# Patient Record
Sex: Male | Born: 1990 | Race: White | Hispanic: No | Marital: Single | Smoking: Never smoker
Health system: Southern US, Community
[De-identification: ages and names within clinical notes are randomized; demographics above are authoritative.]

---

## 2019-11-06 ENCOUNTER — Other Ambulatory Visit: Payer: Self-pay

## 2019-11-06 ENCOUNTER — Emergency Department (HOSPITAL_BASED_OUTPATIENT_CLINIC_OR_DEPARTMENT_OTHER): Payer: BC Managed Care – PPO

## 2019-11-06 ENCOUNTER — Encounter (HOSPITAL_BASED_OUTPATIENT_CLINIC_OR_DEPARTMENT_OTHER): Payer: Self-pay | Admitting: *Deleted

## 2019-11-06 ENCOUNTER — Emergency Department (HOSPITAL_BASED_OUTPATIENT_CLINIC_OR_DEPARTMENT_OTHER)
Admission: EM | Admit: 2019-11-06 | Discharge: 2019-11-07 | Disposition: A | Discharge status: Home / Self Care | Payer: BC Managed Care – PPO | Attending: Emergency Medicine | Admitting: Emergency Medicine

## 2019-11-06 DIAGNOSIS — M545 Low back pain, unspecified: Secondary | ICD-10-CM

## 2019-11-06 DIAGNOSIS — Z91010 Allergy to peanuts: Secondary | ICD-10-CM | POA: Diagnosis not present

## 2019-11-06 MED ORDER — IBUPROFEN 800 MG PO TABS
800.0000 mg | ORAL_TABLET | Freq: Once | ORAL | Status: AC
  Administered 2019-11-06: 800 mg via ORAL
  Filled 2019-11-06: qty 1

## 2019-11-06 MED ORDER — ACETAMINOPHEN 500 MG PO TABS
1000.0000 mg | ORAL_TABLET | Freq: Once | ORAL | Status: DC
  Filled 2019-11-06: qty 2

## 2019-11-06 NOTE — ED Triage Notes (Signed)
C/o lower back pain non radiating x 5 hrs , denies injury

## 2019-11-07 MED ORDER — LIDOCAINE 5 % EX PTCH: 1.0000 | MEDICATED_PATCH | CUTANEOUS | 0 refills | Status: AC

## 2019-11-07 MED ORDER — NAPROXEN 375 MG PO TABS: 375.0000 mg | ORAL_TABLET | Freq: Two times a day (BID) | ORAL | 0 refills | Status: AC

## 2019-11-07 NOTE — ED Provider Notes (Signed)
[facility] EMERGENCY DEPARTMENT Provider Note   CSN: [id number] Arrival date & time: 11/06/19  2007     History Chief Complaint  Patient presents with  . Back Pain    Joshua Weaver is a 29 y.o. male.  The history is provided by the patient.  Back Pain Location:  Sacro-iliac joint Quality:  Aching Radiates to:  Does not radiate Pain severity:  Severe Pain is:  Same all the time Onset quality:  Gradual Duration:  6 hours Timing:  Constant Progression:  Unchanged Chronicity:  New Context: not emotional stress, not falling, not jumping from heights and not lifting heavy objects   Relieved by:  Nothing Worsened by:  Nothing Ineffective treatments:  None tried Associated symptoms: no abdominal pain, no chest pain, no fever, no numbness and no weakness   Risk factors: no hx of cancer   Patient with low back pain since sitting at his desk this evening.  No weakness, no numbness.  No gait abnormalities.  No bowel or bladder symptoms.  No f/c/r.  No trauma.       History reviewed. No pertinent past medical history.  There are no problems to display for this patient.   History reviewed. No pertinent surgical history.     No family history on file.  Social History   Tobacco Use  . Smoking status: Never Smoker  Substance Use Topics  . Alcohol use: Not Currently  . Drug use: Not Currently    Home Medications Prior to Admission medications   Not on File    Allergies    Peanut oil  Review of Systems   Review of Systems  Constitutional: Negative for fever.  HENT: Negative for congestion.   Eyes: Negative for visual disturbance.  Respiratory: Negative for apnea.   Cardiovascular: Negative for chest pain.  Gastrointestinal: Negative for abdominal pain.  Genitourinary: Negative for difficulty urinating.  Musculoskeletal: Positive for back pain. Negative for gait problem.  Neurological: Negative for dizziness, speech difficulty, weakness and numbness.    Psychiatric/Behavioral: Negative for agitation.  All other systems reviewed and are negative.   Physical Exam Updated Vital Signs BP 128/74 (BP Location: Left Arm)   Pulse 74   Temp 98.4 F (36.9 C) (Oral)   Resp 17   Ht 6' (1.829 m)   Wt 101.2 kg   SpO2 99%   BMI 30.24 kg/m   Physical Exam Vitals and nursing note reviewed.  Constitutional:      General: He is not in acute distress.    Appearance: Normal appearance.  HENT:     Head: Normocephalic and atraumatic.     Nose: Nose normal.  Eyes:     Conjunctiva/sclera: Conjunctivae normal.     Pupils: Pupils are equal, round, and reactive to light.  Cardiovascular:     Rate and Rhythm: Normal rate and regular rhythm.     Pulses: Normal pulses.     Heart sounds: Normal heart sounds.  Pulmonary:     Effort: Pulmonary effort is normal.     Breath sounds: Normal breath sounds.  Abdominal:     General: Abdomen is flat. Bowel sounds are normal.     Tenderness: There is no abdominal tenderness. There is no guarding or rebound.  Musculoskeletal:        General: Normal range of motion.     Cervical back: Normal, normal range of motion and neck supple.     Thoracic back: Normal.     Lumbar back: Normal.  Skin:    General: Skin is warm and dry.     Capillary Refill: Capillary refill takes less than 2 seconds.  Neurological:     General: No focal deficit present.     Mental Status: He is alert and oriented to person, place, and time.     Sensory: No sensory deficit.     Motor: No weakness.     Deep Tendon Reflexes: Reflexes normal.  Psychiatric:        Mood and Affect: Mood normal.        Behavior: Behavior normal.     ED Results / Procedures / Treatments   Labs (all labs ordered are listed, but only abnormal results are displayed) Labs Reviewed - No data to display  EKG None  Radiology DG Lumbar Spine Complete  Result Date: 11/07/2019 CLINICAL DATA:  Non radiating low back pain for 5 hours.  No injury. EXAM:  LUMBAR SPINE - COMPLETE 4+ VIEW COMPARISON:  None. FINDINGS: There is no evidence of lumbar spine fracture. Alignment is normal. Intervertebral disc spaces are maintained. IMPRESSION: Negative. Electronically Signed   By: Burman Nieves M.D.   On: 11/07/2019 00:14    Procedures Procedures (including critical care time)  Medications Ordered in ED Medications  acetaminophen (TYLENOL) tablet 1,000 mg (1,000 mg Oral Not Given 11/06/19 2340)  ibuprofen (ADVIL) tablet 800 mg (800 mg Oral Given 11/06/19 2342)    ED Course  I have reviewed the triage vital signs and the nursing notes.  Pertinent labs & imaging results that were available during my care of the patient were reviewed by me and considered in my medical decision making (see chart for details).   No weakness, no bowel or bladder symptoms.  No urinary or GI symptoms.  No trauma. No IVDA no indication for advanced imaging at this time.     Joshua Weaver was evaluated in Emergency Department on 11/07/2019 for the symptoms described in the history of present illness. He was evaluated in the context of the global COVID-19 pandemic, which necessitated consideration that the patient might be at risk for infection with the SARS-CoV-2 virus that causes COVID-19. Institutional protocols and algorithms that pertain to the evaluation of patients at risk for COVID-19 are in a state of rapid change based on information released by regulatory bodies including the CDC and federal and state organizations. These policies and algorithms were followed during the patient's care in the ED.  Final Clinical Impression(s) / ED Diagnoses Return for intractable cough, coughing up blood,fevers >100.4 unrelieved by medication, shortness of breath, intractable vomiting, chest pain, shortness of breath, weakness,numbness, changes in speech, facial asymmetry,abdominal pain, passing out,Inability to tolerate liquids or food, cough, altered mental status or any  concerns. No signs of systemic illness or infection. The patient is nontoxic-appearing on exam and vital signs are within normal limits.   I have reviewed the triage vital signs and the nursing notes. Pertinent labs &imaging results that were available during my care of the patient were reviewed by me and considered in my medical decision making (see chart for details).After history, exam, and medical workup I feel the patient has beenappropriately medically screened and is safe for discharge home. Pertinent diagnoses were discussed with the patient. Patient was given return precautions.      Joshua Denherder, MD 11/07/19 [phone]

## 2021-07-19 IMAGING — DX DG LUMBAR SPINE COMPLETE 4+V
5 series · 5 of 5 positions shown · non-contrast
Comparison: None.

CLINICAL DATA: Non radiating low back pain for 5 hours.  No injury.

EXAM:
LUMBAR SPINE - COMPLETE 4+ VIEW

[l-spine ap]
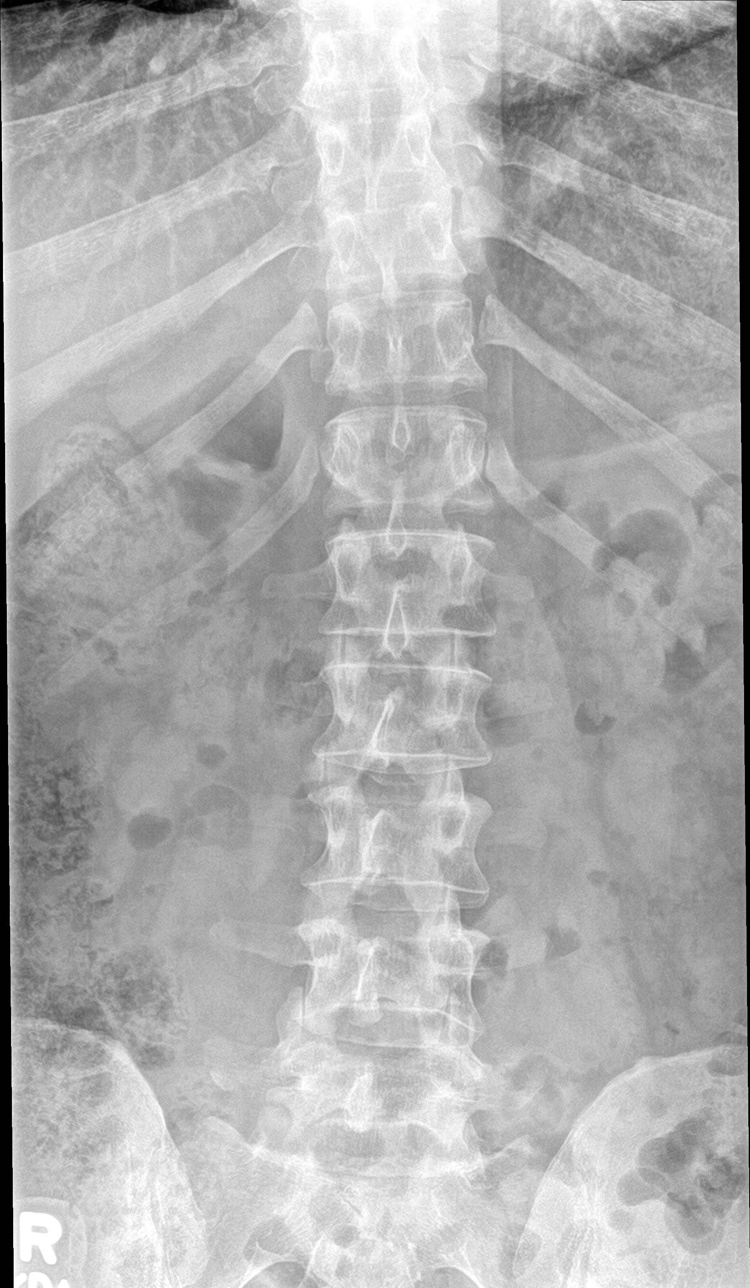

[l-spine obl (1 of 2)]
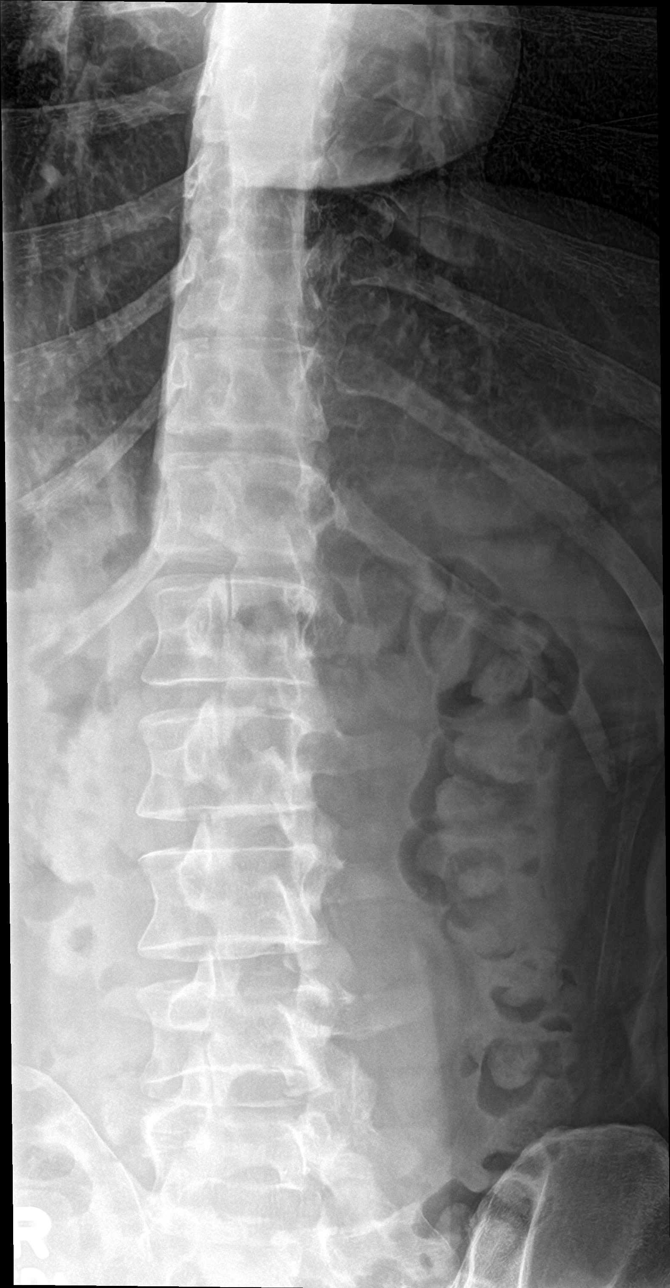

[l-spine obl (2 of 2)]
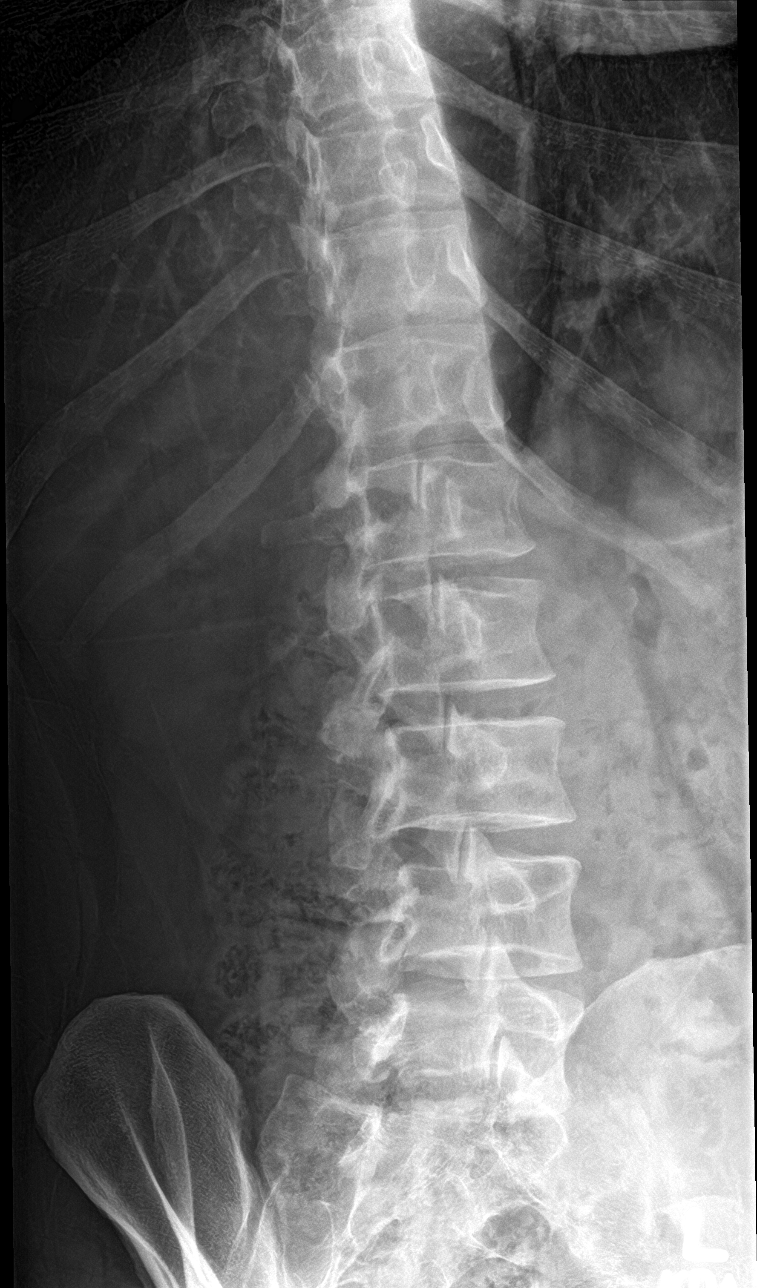

[l-spine lat]
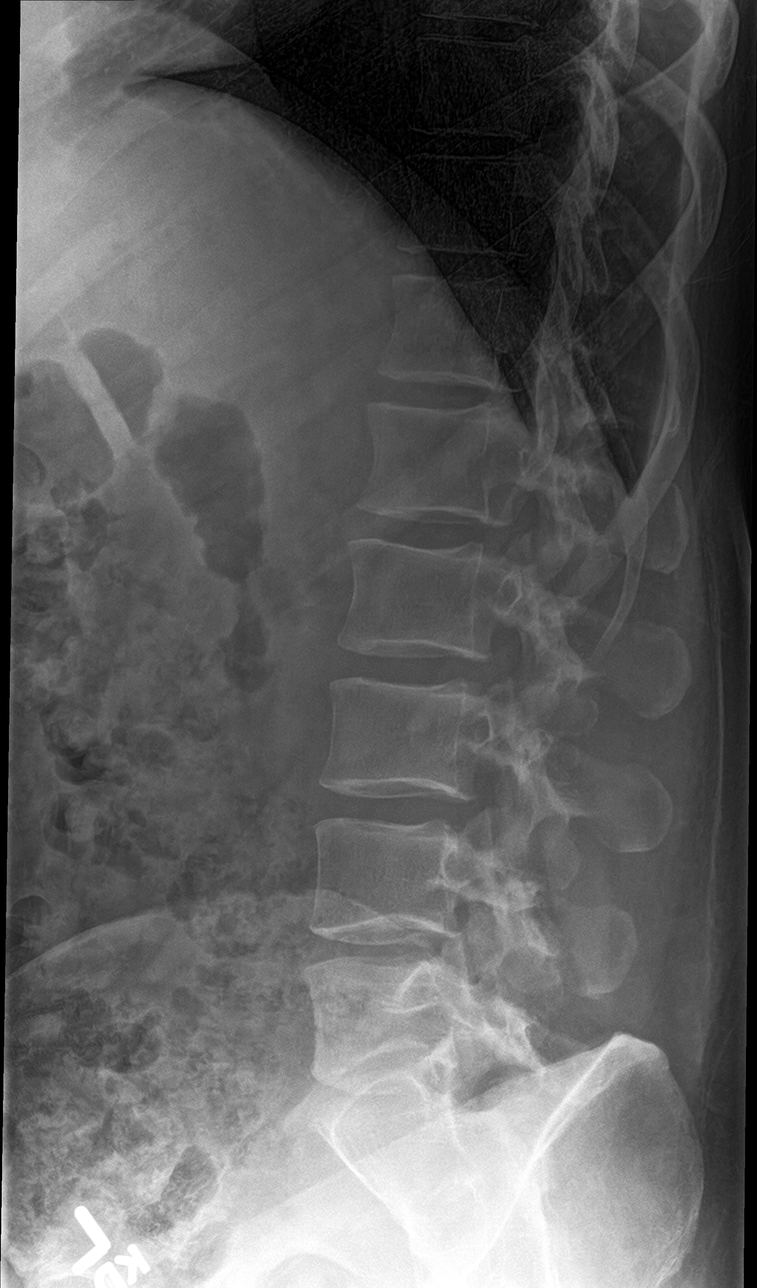

[l-spine spot]
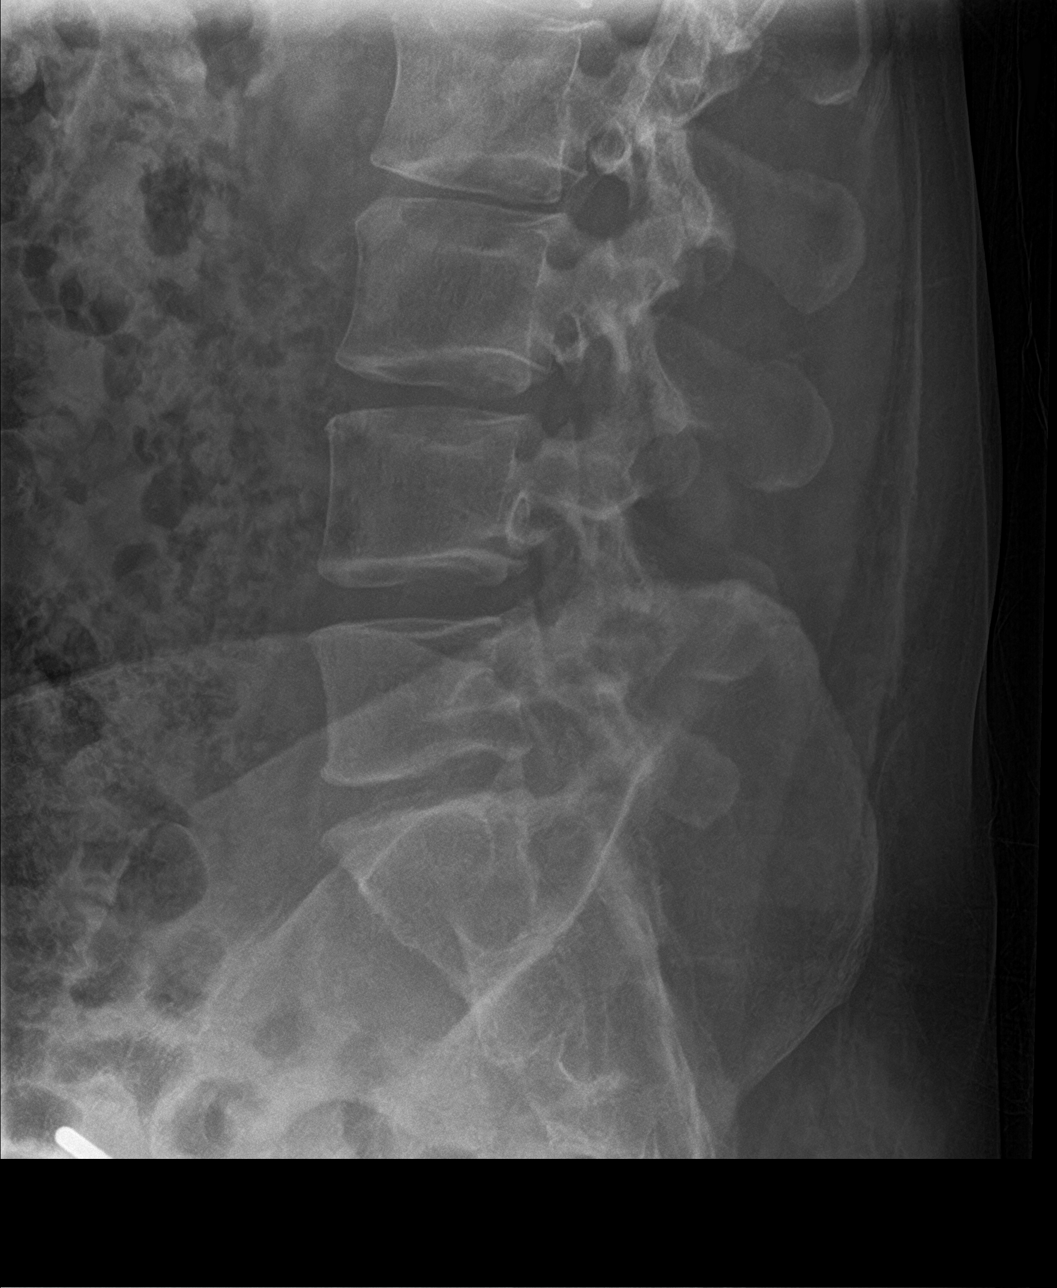

[5 of 5 positions shown; findings below may reference images not displayed]

FINDINGS: There is no evidence of lumbar spine fracture. Alignment is normal.
Intervertebral disc spaces are maintained.
IMPRESSION: Negative.
